# Patient Record
Sex: Female | Born: 1965 | Race: White | Hispanic: No | Marital: Married | Smoking: Never smoker
Health system: Southern US, Community
[De-identification: ages and names within clinical notes are randomized; demographics above are authoritative.]

## PROBLEM LIST (undated history)

## (undated) DIAGNOSIS — K649 Unspecified hemorrhoids: Secondary | ICD-10-CM

## (undated) DIAGNOSIS — I10 Essential (primary) hypertension: Secondary | ICD-10-CM

## (undated) DIAGNOSIS — B3731 Acute candidiasis of vulva and vagina: Secondary | ICD-10-CM

## (undated) DIAGNOSIS — B373 Candidiasis of vulva and vagina: Secondary | ICD-10-CM

## (undated) HISTORY — DX: Acute candidiasis of vulva and vagina: B37.31

## (undated) HISTORY — DX: Unspecified hemorrhoids: K64.9

## (undated) HISTORY — DX: Candidiasis of vulva and vagina: B37.3

## (undated) HISTORY — DX: Essential (primary) hypertension: I10

---

## 1998-09-29 ENCOUNTER — Other Ambulatory Visit: Admission: RE | Admit: 1998-09-29 | Discharge: 1998-09-29 | Payer: Self-pay | Admitting: Obstetrics and Gynecology

## 1999-11-13 ENCOUNTER — Other Ambulatory Visit: Admission: RE | Admit: 1999-11-13 | Discharge: 1999-11-13 | Payer: Self-pay | Admitting: Obstetrics and Gynecology

## 2000-01-24 ENCOUNTER — Encounter: Admission: RE | Admit: 2000-01-24 | Discharge: 2000-01-24 | Payer: Self-pay | Admitting: Obstetrics and Gynecology

## 2000-01-24 ENCOUNTER — Encounter: Payer: Self-pay | Admitting: Obstetrics and Gynecology

## 2000-07-25 ENCOUNTER — Encounter: Admission: RE | Admit: 2000-07-25 | Discharge: 2000-07-25 | Payer: Self-pay | Admitting: Obstetrics and Gynecology

## 2000-07-25 ENCOUNTER — Encounter: Payer: Self-pay | Admitting: Obstetrics and Gynecology

## 2000-11-27 ENCOUNTER — Other Ambulatory Visit: Admission: RE | Admit: 2000-11-27 | Discharge: 2000-11-27 | Payer: Self-pay | Admitting: Obstetrics and Gynecology

## 2001-01-21 ENCOUNTER — Encounter: Admission: RE | Admit: 2001-01-21 | Discharge: 2001-01-21 | Payer: Self-pay | Admitting: Obstetrics and Gynecology

## 2001-01-21 ENCOUNTER — Encounter: Payer: Self-pay | Admitting: Obstetrics and Gynecology

## 2002-02-11 ENCOUNTER — Other Ambulatory Visit: Admission: RE | Admit: 2002-02-11 | Discharge: 2002-02-11 | Payer: Self-pay | Admitting: Obstetrics and Gynecology

## 2003-04-06 ENCOUNTER — Other Ambulatory Visit: Admission: RE | Admit: 2003-04-06 | Discharge: 2003-04-06 | Payer: Self-pay | Admitting: Obstetrics and Gynecology

## 2004-04-25 ENCOUNTER — Other Ambulatory Visit: Admission: RE | Admit: 2004-04-25 | Discharge: 2004-04-25 | Payer: Self-pay | Admitting: Obstetrics and Gynecology

## 2005-08-08 ENCOUNTER — Other Ambulatory Visit: Admission: RE | Admit: 2005-08-08 | Discharge: 2005-08-08 | Payer: Self-pay | Admitting: Obstetrics and Gynecology

## 2006-10-15 ENCOUNTER — Other Ambulatory Visit: Admission: RE | Admit: 2006-10-15 | Discharge: 2006-10-15 | Payer: Self-pay | Admitting: Obstetrics and Gynecology

## 2006-11-05 ENCOUNTER — Encounter: Admission: RE | Admit: 2006-11-05 | Discharge: 2006-11-05 | Payer: Self-pay | Admitting: Obstetrics and Gynecology

## 2007-11-20 ENCOUNTER — Other Ambulatory Visit: Admission: RE | Admit: 2007-11-20 | Discharge: 2007-11-20 | Payer: Self-pay | Admitting: Obstetrics and Gynecology

## 2008-01-13 ENCOUNTER — Encounter: Admission: RE | Admit: 2008-01-13 | Discharge: 2008-01-13 | Payer: Self-pay | Admitting: Obstetrics and Gynecology

## 2009-06-23 ENCOUNTER — Encounter: Admission: RE | Admit: 2009-06-23 | Discharge: 2009-06-23 | Payer: Self-pay | Admitting: Obstetrics and Gynecology

## 2011-01-18 ENCOUNTER — Other Ambulatory Visit: Payer: Self-pay | Admitting: Obstetrics and Gynecology

## 2011-01-18 DIAGNOSIS — Z1231 Encounter for screening mammogram for malignant neoplasm of breast: Secondary | ICD-10-CM

## 2011-01-30 ENCOUNTER — Ambulatory Visit
Admission: RE | Admit: 2011-01-30 | Discharge: 2011-01-30 | Disposition: A | Discharge status: Home / Self Care | Payer: BC Managed Care – PPO | Source: Ambulatory Visit | Attending: Obstetrics and Gynecology | Admitting: Obstetrics and Gynecology

## 2011-01-30 DIAGNOSIS — Z1231 Encounter for screening mammogram for malignant neoplasm of breast: Secondary | ICD-10-CM

## 2012-02-11 ENCOUNTER — Other Ambulatory Visit: Payer: Self-pay | Admitting: Obstetrics and Gynecology

## 2012-02-11 DIAGNOSIS — Z1231 Encounter for screening mammogram for malignant neoplasm of breast: Secondary | ICD-10-CM

## 2012-03-10 ENCOUNTER — Ambulatory Visit
Admission: RE | Admit: 2012-03-10 | Discharge: 2012-03-10 | Disposition: A | Discharge status: Home / Self Care | Payer: BC Managed Care – PPO | Source: Ambulatory Visit | Attending: Obstetrics and Gynecology | Admitting: Obstetrics and Gynecology

## 2012-03-10 DIAGNOSIS — Z1231 Encounter for screening mammogram for malignant neoplasm of breast: Secondary | ICD-10-CM

## 2012-03-17 ENCOUNTER — Ambulatory Visit: Payer: BC Managed Care – PPO

## 2013-06-25 ENCOUNTER — Other Ambulatory Visit: Payer: Self-pay

## 2013-06-25 DIAGNOSIS — Z1231 Encounter for screening mammogram for malignant neoplasm of breast: Secondary | ICD-10-CM

## 2013-07-03 ENCOUNTER — Other Ambulatory Visit: Payer: Self-pay | Admitting: Nurse Practitioner

## 2013-07-03 NOTE — Telephone Encounter (Signed)
AEX scheduled for 07/08/13 #112/0rfs sent (patient takes continuous active pills and usually gets 3 months at a time) sent through to last patient until AEX

## 2013-07-08 ENCOUNTER — Ambulatory Visit (INDEPENDENT_AMBULATORY_CARE_PROVIDER_SITE_OTHER): Payer: BC Managed Care – PPO | Admitting: Nurse Practitioner

## 2013-07-08 ENCOUNTER — Encounter: Payer: Self-pay | Admitting: Nurse Practitioner

## 2013-07-08 VITALS — BP 140/84 | HR 68 | Resp 16 | Ht 65.25 in | Wt 139.0 lb

## 2013-07-08 DIAGNOSIS — Z01419 Encounter for gynecological examination (general) (routine) without abnormal findings: Secondary | ICD-10-CM

## 2013-07-08 MED ORDER — NORETHINDRONE 0.35 MG PO TABS: 1.0000 | ORAL_TABLET | Freq: Every day | ORAL | Status: AC

## 2013-07-08 MED ORDER — NORGESTIMATE-ETH ESTRADIOL 0.25-35 MG-MCG PO TABS: ORAL_TABLET | ORAL | Status: DC

## 2013-07-08 MED ORDER — NORGESTIM-ETH ESTRAD TRIPHASIC 0.18/0.215/0.25 MG-35 MCG PO TABS: 1.0000 | ORAL_TABLET | Freq: Every day | ORAL | Status: DC

## 2013-07-08 MED ORDER — NORGESTIMATE-ETH ESTRADIOL 0.25-35 MG-MCG PO TABS: 1.0000 | ORAL_TABLET | Freq: Every day | ORAL | Status: AC

## 2013-07-08 NOTE — Progress Notes (Signed)
Patient ID: Nicole Ingram, female   DOB: Mar 17, 1966, 47 y.o.   MRN: [id number] 47 y.o. [id number] Married Caucasian Fe here for annual exam.  On OCP menses is regular. New diagnosis of HTN about 3 months ago.  BP still elevated today.  Son is away at [facility] and daughter is at home.  They are having major issues with her.  Patient's last menstrual period was 07/01/2013.          Sexually active: yes  The current method of family planning is OCP (estrogen/progesterone).    Exercising: no  The patient does not participate in regular exercise at present. Smoker:  no  Health Maintenance: Pap:  06/20/12, WNL, neg HR HPV MMG:  03/20/12, BI-Rads 1: negative, is scheduled for this week TDaP:  2006 Labs: PCP    reports that she has never smoked. She has never used smokeless tobacco. She reports that she does not drink alcohol or use illicit drugs.  Past Medical History  Diagnosis Date  . Hypertension     History reviewed. No pertinent past surgical history.  Current Outpatient Prescriptions  Medication Sig Dispense Refill  . lisinopril (PRINIVIL,ZESTRIL) 10 MG tablet Take 1 tablet by mouth daily.      . norgestimate-ethinyl estradiol (SPRINTEC 28) 0.25-35 MG-MCG tablet TAKE 1 TABLET BY MOUTH DAILY **CONTINUOSLY ACTIVE PILLS**4 PACK =3 MONTHS  112 tablet  0   No current facility-administered medications for this visit.    Family History  Problem Relation Age of Onset  . Cancer Mother 46    pancreatic, liver  . Hypertension Mother   . Hypertension Father     ROS:  Pertinent items are noted in HPI.  Otherwise, a comprehensive ROS was negative.  Exam:   BP 140/84  Pulse 68  Resp 16  Ht 5' 5.25" (1.657 m)  Wt 139 lb (63.05 kg)  BMI 22.96 kg/m2  LMP 07/01/2013 Height: 5' 5.25" (165.7 cm)  Ht Readings from Last 3 Encounters:  07/08/13 5' 5.25" (1.657 m)    General appearance: alert, cooperative and appears stated age Head: Normocephalic, without obvious abnormality, atraumatic Neck:  no adenopathy, supple, symmetrical, trachea midline and thyroid normal to inspection and palpation Lungs: clear to auscultation bilaterally Breasts: normal appearance, no masses or tenderness Heart: regular rate and rhythm Abdomen: soft, non-tender; no masses,  no organomegaly Extremities: extremities normal, atraumatic, no cyanosis or edema Skin: Skin color, texture, turgor normal. No rashes or lesions Lymph nodes: Cervical, supraclavicular, and axillary nodes normal. No abnormal inguinal nodes palpated Neurologic: Grossly normal   Pelvic: External genitalia:  no lesions              Urethra:  normal appearing urethra with no masses, tenderness or lesions              Bartholin's and Skene's: normal                 Vagina: normal appearing vagina with normal color and discharge, no lesions              Cervix: anteverted              Pap taken: no Bimanual Exam:  Uterus:  normal size, contour, position, consistency, mobility, non-tender              Adnexa: no mass, fullness, tenderness               Rectovaginal: Confirms  Anus:  normal sphincter tone, no lesions  A:  Well Woman with normal exam  OCP for contraception  New diagnosis of HTN  Change to POP  P:   Pap smear as per guidelines  not done  Mammogram scheduled for next week  Will change OCP to POP - Micronor daily for this next year.    Patient is quite upset about the change because of bad PMS symptoms and hormonally has done well on this OCP.  Discussed rationale and she is willing to try. Tearful about the change.  She is on first week of active pills and needs to complete this pack.  Counseled on breast self exam, adequate intake of calcium and vitamin D, diet and exercise, Kegel's exercises return annually or prn  An After Visit Summary was printed and given to the patient.

## 2013-07-08 NOTE — Patient Instructions (Signed)

## 2013-07-12 NOTE — Progress Notes (Signed)
Encounter reviewed by Dr. Brook Silva.  

## 2013-07-17 ENCOUNTER — Ambulatory Visit: Payer: BC Managed Care – PPO

## 2013-07-24 ENCOUNTER — Telehealth: Payer: Self-pay | Admitting: Nurse Practitioner

## 2013-07-24 NOTE — Telephone Encounter (Signed)
pt has some questions about a medication she is taking

## 2013-07-27 NOTE — Telephone Encounter (Signed)
Spoke with pt about POP she was recently prescribed at [facility]. Pt still taking last pack of Sprintec, but she looked the new pill up online and has several concerns. Pt is prone to bad acne and yeast infections, and she read that a POP can exacerbate both. Pt also has been taking the Sprintec to help her bad PMS symptoms. Pt is hesitant to start the new pill. Sched appt with PG to discuss concerns 07-30-13 at 2:15.

## 2013-07-29 ENCOUNTER — Encounter: Payer: Self-pay | Admitting: Obstetrics and Gynecology

## 2013-07-30 ENCOUNTER — Ambulatory Visit (INDEPENDENT_AMBULATORY_CARE_PROVIDER_SITE_OTHER): Payer: BC Managed Care – PPO | Admitting: Nurse Practitioner

## 2013-07-30 ENCOUNTER — Encounter: Payer: Self-pay | Admitting: Nurse Practitioner

## 2013-07-30 VITALS — BP 130/70 | HR 60 | Ht 65.25 in | Wt 140.0 lb

## 2013-07-30 DIAGNOSIS — Z3009 Encounter for other general counseling and advice on contraception: Secondary | ICD-10-CM

## 2013-07-30 NOTE — Progress Notes (Signed)
Encounter reviewed by Dr. Brook Silva.  

## 2013-07-30 NOTE — Patient Instructions (Signed)
Contraception Choices  Contraception (birth control) is the use of any methods or devices to prevent pregnancy. Below are some methods to help avoid pregnancy.  HORMONAL METHODS   · Contraceptive implant. This is a thin, plastic tube containing progesterone hormone. It does not contain estrogen hormone. Your caregiver inserts the tube in the inner part of the upper arm. The tube can remain in place for up to 3 years. After 3 years, the implant must be removed. The implant prevents the ovaries from releasing an egg (ovulation), thickens the cervical mucus which prevents sperm from entering the uterus, and thins the lining of the inside of the uterus.  · Progesterone-only injections. These injections are given every 3 months by your caregiver to prevent pregnancy. This synthetic progesterone hormone stops the ovaries from releasing eggs. It also thickens cervical mucus and changes the uterine lining. This makes it harder for sperm to survive in the uterus.  · Birth control pills. These pills contain estrogen and progesterone hormone. They work by stopping the egg from forming in the ovary (ovulation). Birth control pills are prescribed by a caregiver. Birth control pills can also be used to treat heavy periods.  · Minipill. This type of birth control pill contains only the progesterone hormone. They are taken every day of each month and must be prescribed by your caregiver.  · Birth control patch. The patch contains hormones similar to those in birth control pills. It must be changed once a week and is prescribed by a caregiver.  · Vaginal ring. The ring contains hormones similar to those in birth control pills. It is left in the vagina for 3 weeks, removed for 1 week, and then a new one is put back in place. The patient must be comfortable inserting and removing the ring from the vagina. A caregiver's prescription is necessary.  · Emergency contraception. Emergency contraceptives prevent pregnancy after unprotected  sexual intercourse. This pill can be taken right after sex or up to 5 days after unprotected sex. It is most effective the sooner you take the pills after having sexual intercourse. Emergency contraceptive pills are available without a prescription. Check with your pharmacist. Do not use emergency contraception as your only form of birth control.  BARRIER METHODS   · Female condom. This is a thin sheath (latex or rubber) that is worn over the penis during sexual intercourse. It can be used with spermicide to increase effectiveness.  · Female condom. This is a soft, loose-fitting sheath that is put into the vagina before sexual intercourse.  · Diaphragm. This is a soft, latex, dome-shaped barrier that must be fitted by a caregiver. It is inserted into the vagina, along with a spermicidal jelly. It is inserted before intercourse. The diaphragm should be left in the vagina for 6 to 8 hours after intercourse.  · Cervical cap. This is a round, soft, latex or plastic cup that fits over the cervix and must be fitted by a caregiver. The cap can be left in place for up to 48 hours after intercourse.  · Sponge. This is a soft, circular piece of polyurethane foam. The sponge has spermicide in it. It is inserted into the vagina after wetting it and before sexual intercourse.  · Spermicides. These are chemicals that kill or block sperm from entering the cervix and uterus. They come in the form of creams, jellies, suppositories, foam, or tablets. They do not require a prescription. They are inserted into the vagina with an applicator before having sexual intercourse.   The process must be repeated every time you have sexual intercourse.  INTRAUTERINE CONTRACEPTION  · Intrauterine device (IUD). This is a T-shaped device that is put in a woman's uterus during a menstrual period to prevent pregnancy. There are 2 types:  · Copper IUD. This type of IUD is wrapped in copper wire and is placed inside the uterus. Copper makes the uterus and  fallopian tubes produce a fluid that kills sperm. It can stay in place for 10 years.  · Hormone IUD. This type of IUD contains the hormone progestin (synthetic progesterone). The hormone thickens the cervical mucus and prevents sperm from entering the uterus, and it also thins the uterine lining to prevent implantation of a fertilized egg. The hormone can weaken or kill the sperm that get into the uterus. It can stay in place for 5 years.  PERMANENT METHODS OF CONTRACEPTION  · Female tubal ligation. This is when the woman's fallopian tubes are surgically sealed, tied, or blocked to prevent the egg from traveling to the uterus.  · Female sterilization. This is when the female has the tubes that carry sperm tied off (vasectomy). This blocks sperm from entering the vagina during sexual intercourse. After the procedure, the man can still ejaculate fluid (semen).  NATURAL PLANNING METHODS  · Natural family planning. This is not having sexual intercourse or using a barrier method (condom, diaphragm, cervical cap) on days the woman could become pregnant.  · Calendar method. This is keeping track of the length of each menstrual cycle and identifying when you are fertile.  · Ovulation method. This is avoiding sexual intercourse during ovulation.  · Symptothermal method. This is avoiding sexual intercourse during ovulation, using a thermometer and ovulation symptoms.  · Post-ovulation method. This is timing sexual intercourse after you have ovulated.  Regardless of which type or method of contraception you choose, it is important that you use condoms to protect against the transmission of sexually transmitted diseases (STDs). Talk with your caregiver about which form of contraception is most appropriate for you.  Document Released: 09/17/2005 Document Revised: 12/10/2011 Document Reviewed: 01/24/2011  [employer]® Patient Information ©2014 [employer].

## 2013-07-30 NOTE — Progress Notes (Signed)
Subjective:     Patient ID: Nicole Ingram, female   DOB: 05-08-1966, 47 y.o.   MRN: [id number]  HPI This 47 yo WM Fe comes in for consultation about birth control options.  She is now on HTN med's and we have advised for her to come off OCP.  She is very reluctant because of her history of PMS, IC, chronic yeast and acne.  After discussing via phone about changing to POP - Micronor.  She has gone in ine and has read about other potential side effects with increase yeast and acne and comes now to discuss. OCP ended this past Sunday with cycle starting today.  Review of Systems General review is negative with no history of IC flare in 3 years.    Objective:   Physical Exam    Not indicated today.  She is quite anxious. Assessment:     Hypertension on medication Needs for contraception Concerns about acne, IC, and yeast infections    Plan:     After long discussion - she has agreed to stay off all OCP and POP for several months to see how well she does/ or does not do.   She will call back if any symptoms worsens. If amenorrhea for more than 3 months to call back. Also discussed use of Mirena IUD as another option of contraception. But do not advise Nexplanon secondary to history of acne.     Consult time face to face was 20 minutes.

## 2013-08-05 ENCOUNTER — Ambulatory Visit
Admission: RE | Admit: 2013-08-05 | Discharge: 2013-08-05 | Disposition: A | Discharge status: Home / Self Care | Payer: BC Managed Care – PPO | Source: Ambulatory Visit

## 2013-08-05 DIAGNOSIS — Z1231 Encounter for screening mammogram for malignant neoplasm of breast: Secondary | ICD-10-CM

## 2014-08-02 ENCOUNTER — Encounter: Payer: Self-pay | Admitting: Nurse Practitioner

## 2014-10-15 ENCOUNTER — Telehealth: Payer: Self-pay | Admitting: Nurse Practitioner

## 2014-10-15 NOTE — Telephone Encounter (Addendum)
Patient is at her eye doctor's office right now, and is asking if Nicole Ingram would approve a referral to be seen at the eye doctor. . Patient's husband "Nicole Ingram" is calling on her behalf, stating they have our office listed as her primary care with her insurance.. I told him we are not a primary care office, but I would send a message to our triage nurse. ROI on file to talk with him.  Patient last seen 07/30/13.

## 2014-10-15 NOTE — Telephone Encounter (Signed)
[facility]. [facility] Patient has not been seen by our practice since 07/30/13. Patient states Nicole Ingram is her PCP but that husband mistakenly placed our office as PCP. Advised cannot place referral as we have not seen patient for issue. Patient understands.  Routing to provider for final review. Patient agreeable to disposition. Will close encounter

## 2015-10-31 ENCOUNTER — Other Ambulatory Visit: Payer: Self-pay

## 2015-10-31 ENCOUNTER — Telehealth: Payer: Self-pay

## 2015-10-31 DIAGNOSIS — Z1231 Encounter for screening mammogram for malignant neoplasm of breast: Secondary | ICD-10-CM

## 2015-10-31 NOTE — Telephone Encounter (Signed)
Spoke with patient. Patient states that she would like to cancel her aex appointment that is scheduled for 2/12017 with Ria Comment, FNP due to insurance coverage concerns. Patient is asking if she is due to have a pap smear this year or if she now gets her pap smears every 3 years. "If I need a pap smear I will come in, but if I do not I can't come in with the cost." Routing to Ria Comment, FNP for review and advise.

## 2015-10-31 NOTE — Telephone Encounter (Signed)
Yes this is her pap year.  If does want this month OK but should be soon.  Last pap 06/20/2012 - 3 years would have been 06/2015.

## 2015-11-01 NOTE — Telephone Encounter (Signed)
Spoke with patient. Advised of message as seen below from Ria Comment, FNP. Patient is agreeable. She would like to return call to schedule after she has her mammogram performed.  Routing to provider for final review. Patient agreeable to disposition. Will close encounter.

## 2015-11-02 ENCOUNTER — Ambulatory Visit: Payer: Self-pay | Admitting: Nurse Practitioner

## 2015-11-09 ENCOUNTER — Ambulatory Visit
Admission: RE | Admit: 2015-11-09 | Discharge: 2015-11-09 | Disposition: A | Discharge status: Home / Self Care | Payer: No Typology Code available for payment source | Source: Ambulatory Visit

## 2015-11-09 DIAGNOSIS — Z1231 Encounter for screening mammogram for malignant neoplasm of breast: Secondary | ICD-10-CM

## 2015-11-10 ENCOUNTER — Other Ambulatory Visit: Payer: Self-pay | Admitting: Nurse Practitioner

## 2015-11-10 DIAGNOSIS — R928 Other abnormal and inconclusive findings on diagnostic imaging of breast: Secondary | ICD-10-CM

## 2015-11-14 ENCOUNTER — Ambulatory Visit
Admission: RE | Admit: 2015-11-14 | Discharge: 2015-11-14 | Disposition: A | Discharge status: Home / Self Care | Payer: No Typology Code available for payment source | Source: Ambulatory Visit | Attending: Nurse Practitioner | Admitting: Nurse Practitioner

## 2015-11-14 DIAGNOSIS — R928 Other abnormal and inconclusive findings on diagnostic imaging of breast: Secondary | ICD-10-CM

## 2017-07-10 ENCOUNTER — Other Ambulatory Visit: Payer: Self-pay | Admitting: Internal Medicine

## 2017-07-10 DIAGNOSIS — Z1231 Encounter for screening mammogram for malignant neoplasm of breast: Secondary | ICD-10-CM

## 2017-07-26 ENCOUNTER — Ambulatory Visit
Admission: RE | Admit: 2017-07-26 | Discharge: 2017-07-26 | Disposition: A | Discharge status: Home / Self Care | Payer: No Typology Code available for payment source | Source: Ambulatory Visit | Attending: Internal Medicine | Admitting: Internal Medicine

## 2017-07-26 DIAGNOSIS — Z1231 Encounter for screening mammogram for malignant neoplasm of breast: Secondary | ICD-10-CM

## 2020-05-10 ENCOUNTER — Other Ambulatory Visit: Payer: Self-pay | Admitting: Family Medicine

## 2020-05-10 DIAGNOSIS — Z1231 Encounter for screening mammogram for malignant neoplasm of breast: Secondary | ICD-10-CM

## 2020-05-20 ENCOUNTER — Ambulatory Visit
Admission: RE | Admit: 2020-05-20 | Discharge: 2020-05-20 | Disposition: A | Discharge status: Home / Self Care | Payer: No Typology Code available for payment source | Source: Ambulatory Visit | Attending: Family Medicine | Admitting: Family Medicine

## 2020-05-20 ENCOUNTER — Other Ambulatory Visit: Payer: Self-pay

## 2020-05-20 DIAGNOSIS — Z1231 Encounter for screening mammogram for malignant neoplasm of breast: Secondary | ICD-10-CM

## 2021-03-15 IMAGING — MG DIGITAL SCREENING BILAT W/ TOMO W/ CAD
8 series · 8 of 24 positions shown · non-contrast
Comparison: Previous exam(s).

CLINICAL DATA: Screening.

EXAM:
DIGITAL SCREENING BILATERAL MAMMOGRAM WITH TOMO AND CAD

[L CC synth-2D]
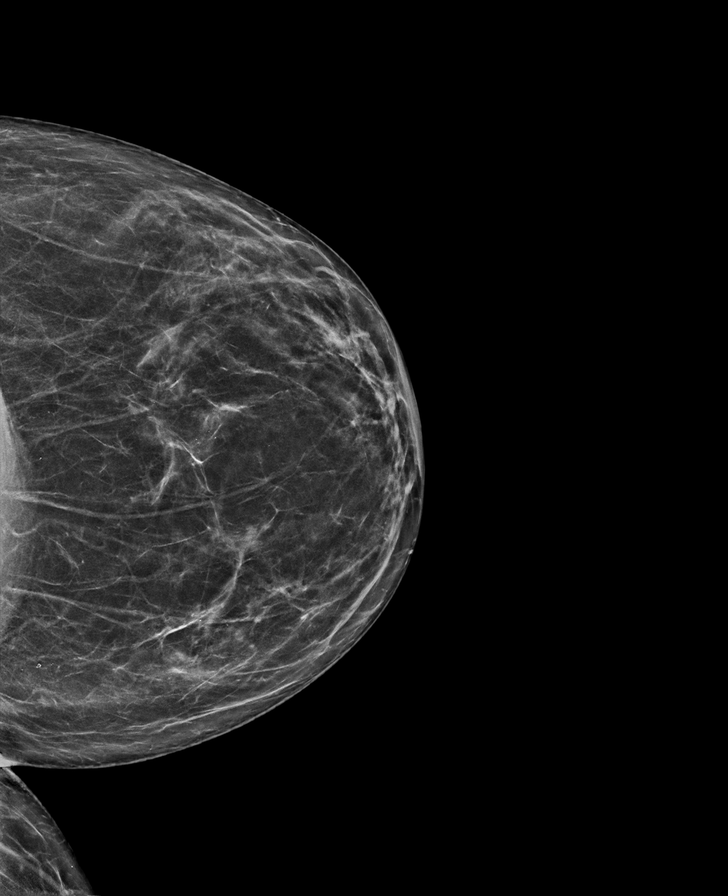

[R MLO synth-2D]
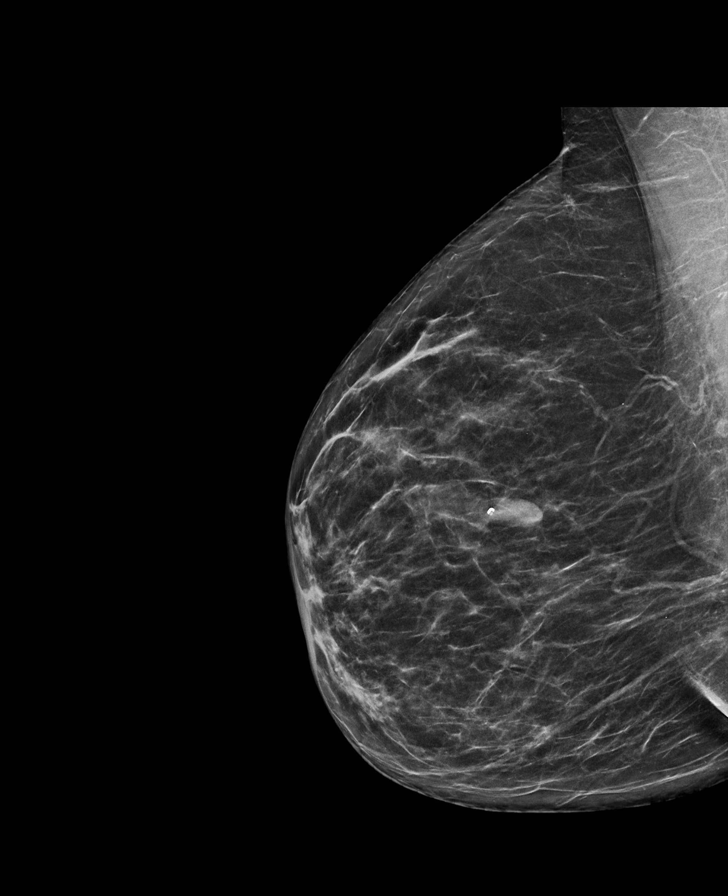

[L MLO synth-2D]
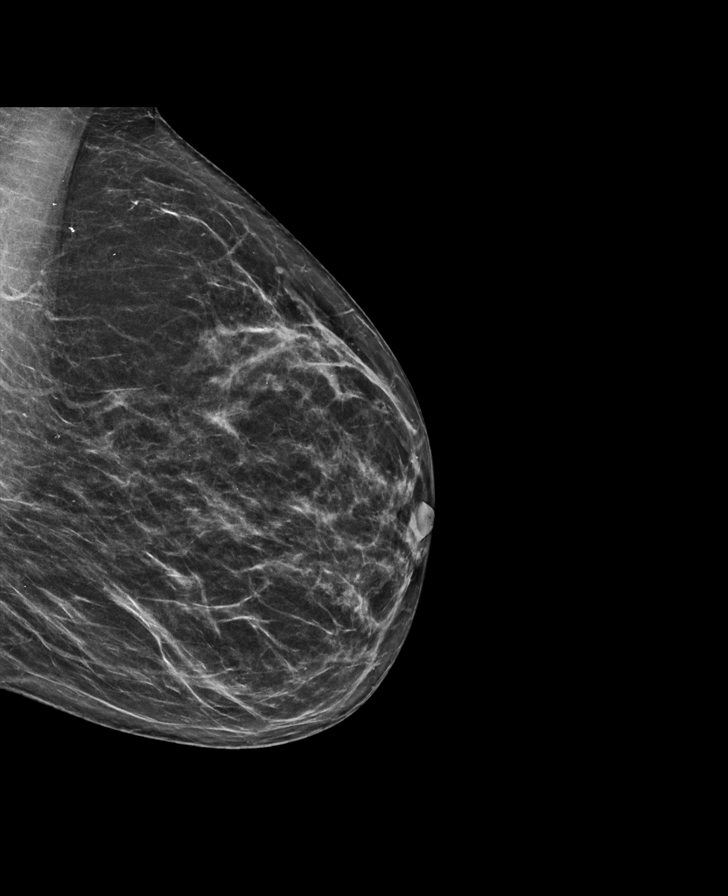

[R CC synth-2D]
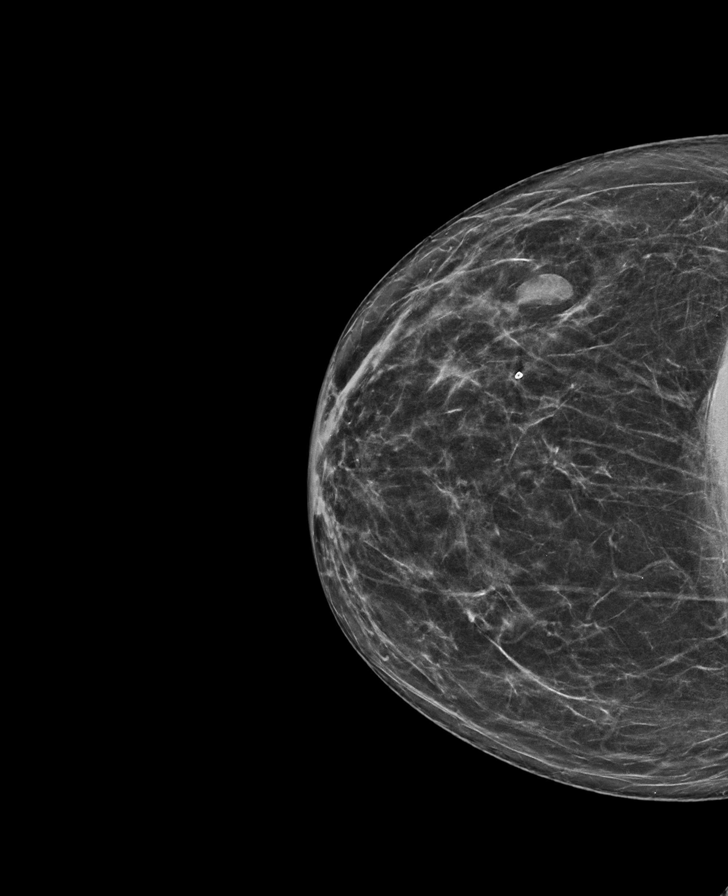

[R MLO tomo · tomo slice 35/68.0]
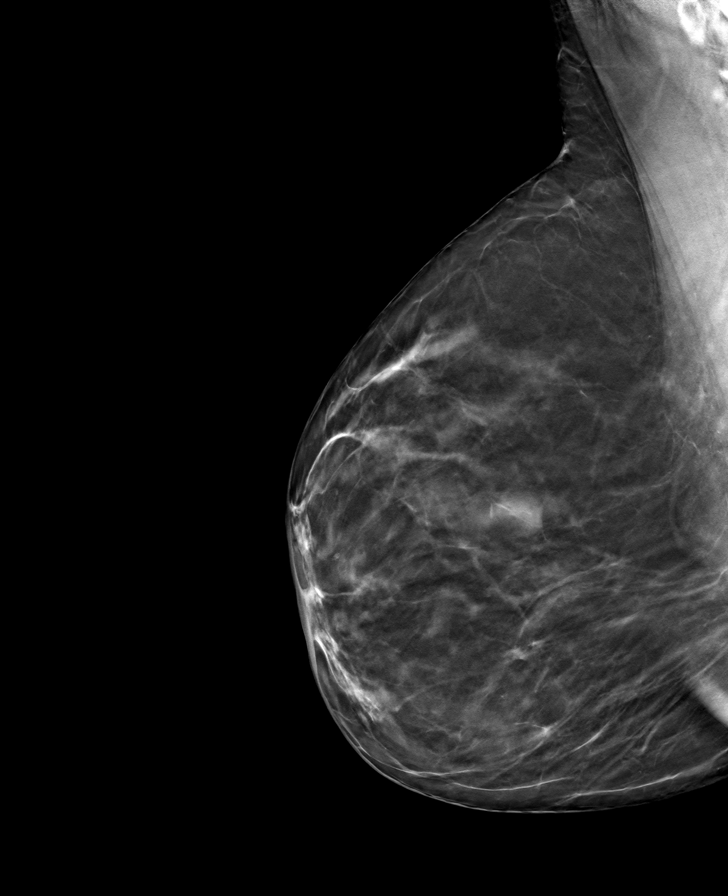

[L MLO tomo · tomo slice 33/66.0]
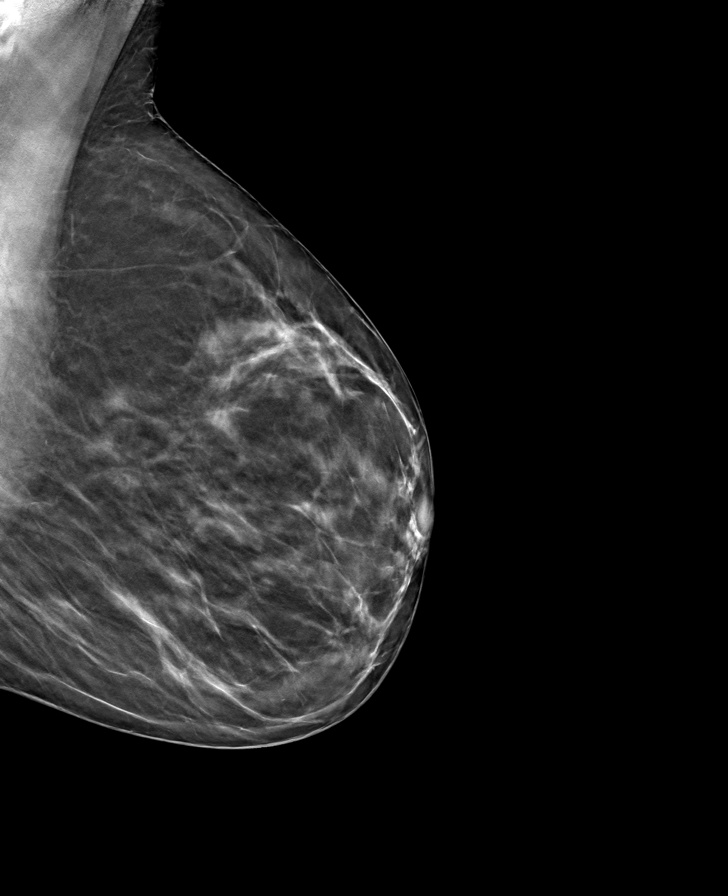

[L CC tomo · tomo slice 33/66.0]
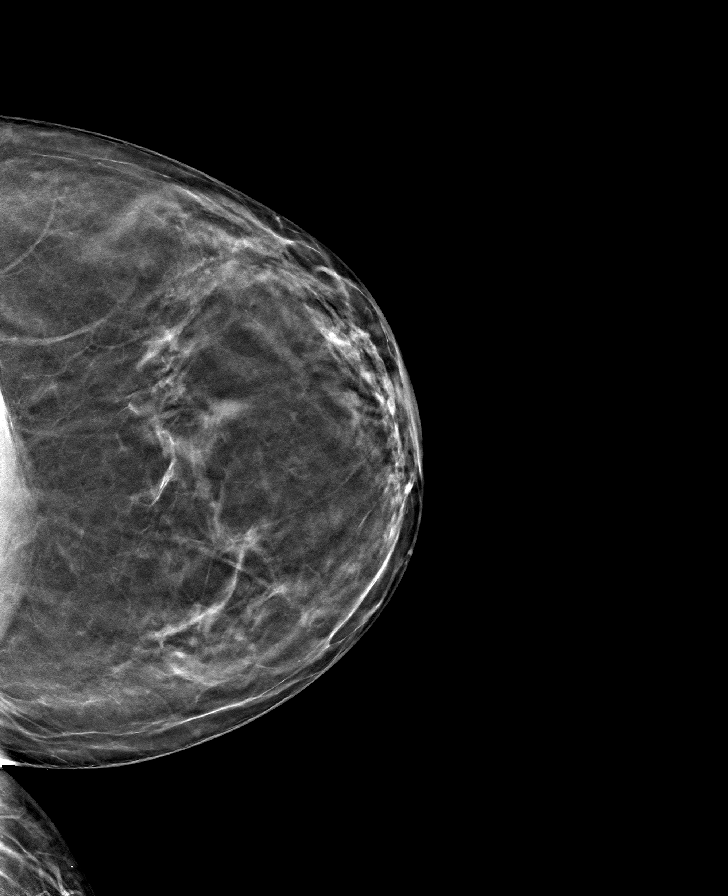

[R CC tomo · tomo slice 33/65.0]
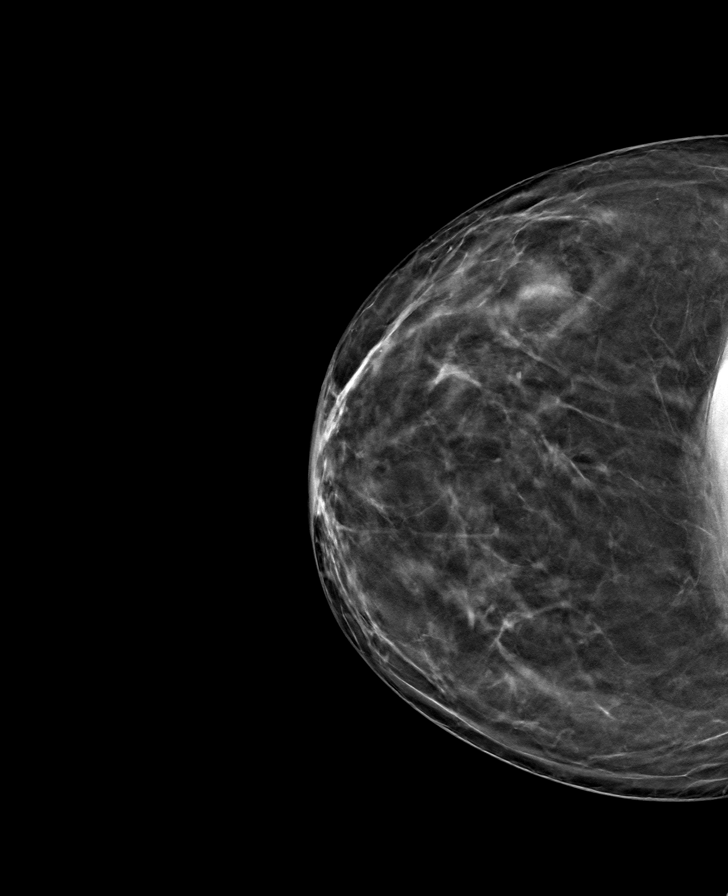

[8 of 24 positions shown; findings below may reference images not displayed]

ACR Breast Density Category b: There are scattered areas of
fibroglandular density.
FINDINGS: There are no findings suspicious for malignancy. Images were
processed with CAD.
IMPRESSION: No mammographic evidence of malignancy. A result letter of this
screening mammogram will be mailed directly to the patient.

RECOMMENDATION:
Screening mammogram in one year. (Code:[id number])

BI-RADS CATEGORY  1: Negative.
